# Patient Record
Sex: Female | Born: 1965 | Race: White | Hispanic: No | Marital: Married | Smoking: Current every day smoker
Health system: Southern US, Community
[De-identification: ages and names within clinical notes are randomized; demographics above are authoritative.]

---

## 2018-11-30 ENCOUNTER — Encounter (HOSPITAL_BASED_OUTPATIENT_CLINIC_OR_DEPARTMENT_OTHER): Payer: Self-pay | Admitting: Emergency Medicine

## 2018-11-30 ENCOUNTER — Other Ambulatory Visit: Payer: Self-pay

## 2018-11-30 ENCOUNTER — Emergency Department (HOSPITAL_BASED_OUTPATIENT_CLINIC_OR_DEPARTMENT_OTHER)
Admission: EM | Admit: 2018-11-30 | Discharge: 2018-11-30 | Disposition: A | Discharge status: Home / Self Care | Payer: No Typology Code available for payment source | Attending: Emergency Medicine | Admitting: Emergency Medicine

## 2018-11-30 ENCOUNTER — Emergency Department (HOSPITAL_BASED_OUTPATIENT_CLINIC_OR_DEPARTMENT_OTHER): Payer: No Typology Code available for payment source

## 2018-11-30 DIAGNOSIS — R42 Dizziness and giddiness: Secondary | ICD-10-CM | POA: Insufficient documentation

## 2018-11-30 DIAGNOSIS — S199XXA Unspecified injury of neck, initial encounter: Secondary | ICD-10-CM | POA: Diagnosis present

## 2018-11-30 DIAGNOSIS — S161XXA Strain of muscle, fascia and tendon at neck level, initial encounter: Secondary | ICD-10-CM | POA: Diagnosis not present

## 2018-11-30 DIAGNOSIS — Y9389 Activity, other specified: Secondary | ICD-10-CM | POA: Diagnosis not present

## 2018-11-30 DIAGNOSIS — Y92410 Unspecified street and highway as the place of occurrence of the external cause: Secondary | ICD-10-CM | POA: Diagnosis not present

## 2018-11-30 DIAGNOSIS — Y998 Other external cause status: Secondary | ICD-10-CM | POA: Insufficient documentation

## 2018-11-30 DIAGNOSIS — M7918 Myalgia, other site: Secondary | ICD-10-CM

## 2018-11-30 DIAGNOSIS — F17200 Nicotine dependence, unspecified, uncomplicated: Secondary | ICD-10-CM | POA: Diagnosis not present

## 2018-11-30 LAB — BASIC METABOLIC PANEL
Anion gap: 9 (ref 5–15)
BUN: 16 mg/dL (ref 6–20)
CO2: 26 mmol/L (ref 22–32)
Calcium: 8.6 mg/dL — ABNORMAL LOW (ref 8.9–10.3)
Chloride: 101 mmol/L (ref 98–111)
Creatinine, Ser: 0.65 mg/dL (ref 0.44–1.00)
GFR calc Af Amer: >60 mL/min (ref >60)
GFR calc non Af Amer: >60 mL/min (ref >60)
Glucose, Bld: 115 mg/dL — ABNORMAL HIGH (ref 70–99)
Potassium: 3.3 mmol/L — ABNORMAL LOW (ref 3.5–5.1)
Sodium: 136 mmol/L (ref 135–145)

## 2018-11-30 LAB — CBC WITH DIFFERENTIAL/PLATELET
Abs Immature Granulocytes: 0.02 10*3/uL (ref 0.00–0.07)
Basophils Absolute: 0 10*3/uL (ref 0.0–0.1)
Basophils Relative: 1 %
Eosinophils Absolute: 0.1 10*3/uL (ref 0.0–0.5)
Eosinophils Relative: 2 %
HCT: 44.8 % (ref 36.0–46.0)
Hemoglobin: 14.1 g/dL (ref 12.0–15.0)
Immature Granulocytes: 0 %
Lymphocytes Relative: 46 %
Lymphs Abs: 3.1 10*3/uL (ref 0.7–4.0)
MCH: 31.5 pg (ref 26.0–34.0)
MCHC: 31.5 g/dL (ref 30.0–36.0)
MCV: 100.2 fL — ABNORMAL HIGH (ref 80.0–100.0)
Monocytes Absolute: 0.5 10*3/uL (ref 0.1–1.0)
Monocytes Relative: 8 %
Neutro Abs: 2.9 10*3/uL (ref 1.7–7.7)
Neutrophils Relative %: 43 %
Platelets: 216 10*3/uL (ref 150–400)
RBC: 4.47 MIL/uL (ref 3.87–5.11)
RDW: 13.3 % (ref 11.5–15.5)
WBC: 6.7 10*3/uL (ref 4.0–10.5)
nRBC: 0 % (ref 0.0–0.2)

## 2018-11-30 MED ORDER — IOHEXOL 350 MG/ML SOLN
100.0000 mL | Freq: Once | INTRAVENOUS | Status: AC | PRN
  Administered 2018-11-30: 100 mL via INTRAVENOUS

## 2018-11-30 MED ORDER — METHOCARBAMOL 500 MG PO TABS: 500.0000 mg | ORAL_TABLET | Freq: Two times a day (BID) | ORAL | 0 refills | Status: AC

## 2018-11-30 MED ORDER — ONDANSETRON HCL 4 MG/2ML IJ SOLN
4.0000 mg | Freq: Once | INTRAMUSCULAR | Status: AC
  Administered 2018-11-30: 4 mg via INTRAVENOUS
  Filled 2018-11-30: qty 2

## 2018-11-30 MED ORDER — HYDROCODONE-ACETAMINOPHEN 5-325 MG PO TABS
1.0000 | ORAL_TABLET | Freq: Once | ORAL | Status: DC
  Filled 2018-11-30: qty 1
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
## 2018-11-30 NOTE — Discharge Instructions (Signed)
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
## 2018-11-30 NOTE — ED Notes (Signed)
CT will need to wait for labs, also need to verify order with ordering PA Graciella Freer
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
